# Patient Record
Sex: Male | Born: 1980 | Race: White | Hispanic: No | Marital: Single | Smoking: Current every day smoker
Health system: Southern US, Community
[De-identification: ages and names within clinical notes are randomized; demographics above are authoritative.]

## PROBLEM LIST (undated history)

## (undated) ENCOUNTER — Ambulatory Visit: Admission: EM | Payer: BC Managed Care – PPO | Source: Home / Self Care

## (undated) HISTORY — PX: TYMPANOSTOMY TUBE PLACEMENT: SHX32

## (undated) HISTORY — PX: APPENDECTOMY: SHX54

---

## 2003-03-19 ENCOUNTER — Emergency Department (HOSPITAL_COMMUNITY): Admission: EM | Admit: 2003-03-19 | Discharge: 2003-03-19 | Payer: Self-pay | Admitting: Emergency Medicine

## 2003-06-10 ENCOUNTER — Emergency Department (HOSPITAL_COMMUNITY): Admission: EM | Admit: 2003-06-10 | Discharge: 2003-06-10 | Payer: Self-pay | Admitting: Emergency Medicine

## 2003-09-12 ENCOUNTER — Emergency Department (HOSPITAL_COMMUNITY): Admission: EM | Admit: 2003-09-12 | Discharge: 2003-09-12 | Payer: Self-pay | Admitting: Emergency Medicine

## 2008-12-05 ENCOUNTER — Emergency Department (HOSPITAL_COMMUNITY): Admission: EM | Admit: 2008-12-05 | Discharge: 2008-12-05 | Payer: Self-pay | Admitting: Emergency Medicine

## 2009-04-19 ENCOUNTER — Emergency Department (HOSPITAL_COMMUNITY): Admission: EM | Admit: 2009-04-19 | Discharge: 2009-04-19 | Payer: Self-pay | Admitting: Emergency Medicine

## 2009-04-24 ENCOUNTER — Emergency Department (HOSPITAL_COMMUNITY): Admission: EM | Admit: 2009-04-24 | Discharge: 2009-04-24 | Payer: Self-pay | Admitting: Emergency Medicine

## 2009-09-20 ENCOUNTER — Emergency Department (HOSPITAL_COMMUNITY): Admission: EM | Admit: 2009-09-20 | Discharge: 2009-09-20 | Payer: Self-pay | Admitting: Emergency Medicine

## 2009-10-07 ENCOUNTER — Emergency Department (HOSPITAL_COMMUNITY): Admission: EM | Admit: 2009-10-07 | Discharge: 2009-10-07 | Payer: Self-pay | Admitting: Emergency Medicine

## 2009-12-17 ENCOUNTER — Emergency Department (HOSPITAL_COMMUNITY): Admission: EM | Admit: 2009-12-17 | Discharge: 2009-12-17 | Payer: Self-pay | Admitting: Emergency Medicine

## 2010-01-15 ENCOUNTER — Emergency Department (HOSPITAL_COMMUNITY)
Admission: EM | Admit: 2010-01-15 | Discharge: 2010-01-15 | Payer: Self-pay | Source: Home / Self Care | Admitting: Emergency Medicine

## 2011-06-02 IMAGING — CR DG CHEST 2V
2 series · 2 of 2 positions shown · non-contrast
Comparison: None.

CLINICAL DATA: Cough and congestion.  Flu symptoms.

CHEST - 2 VIEW

[view not recorded (1 of 2)]
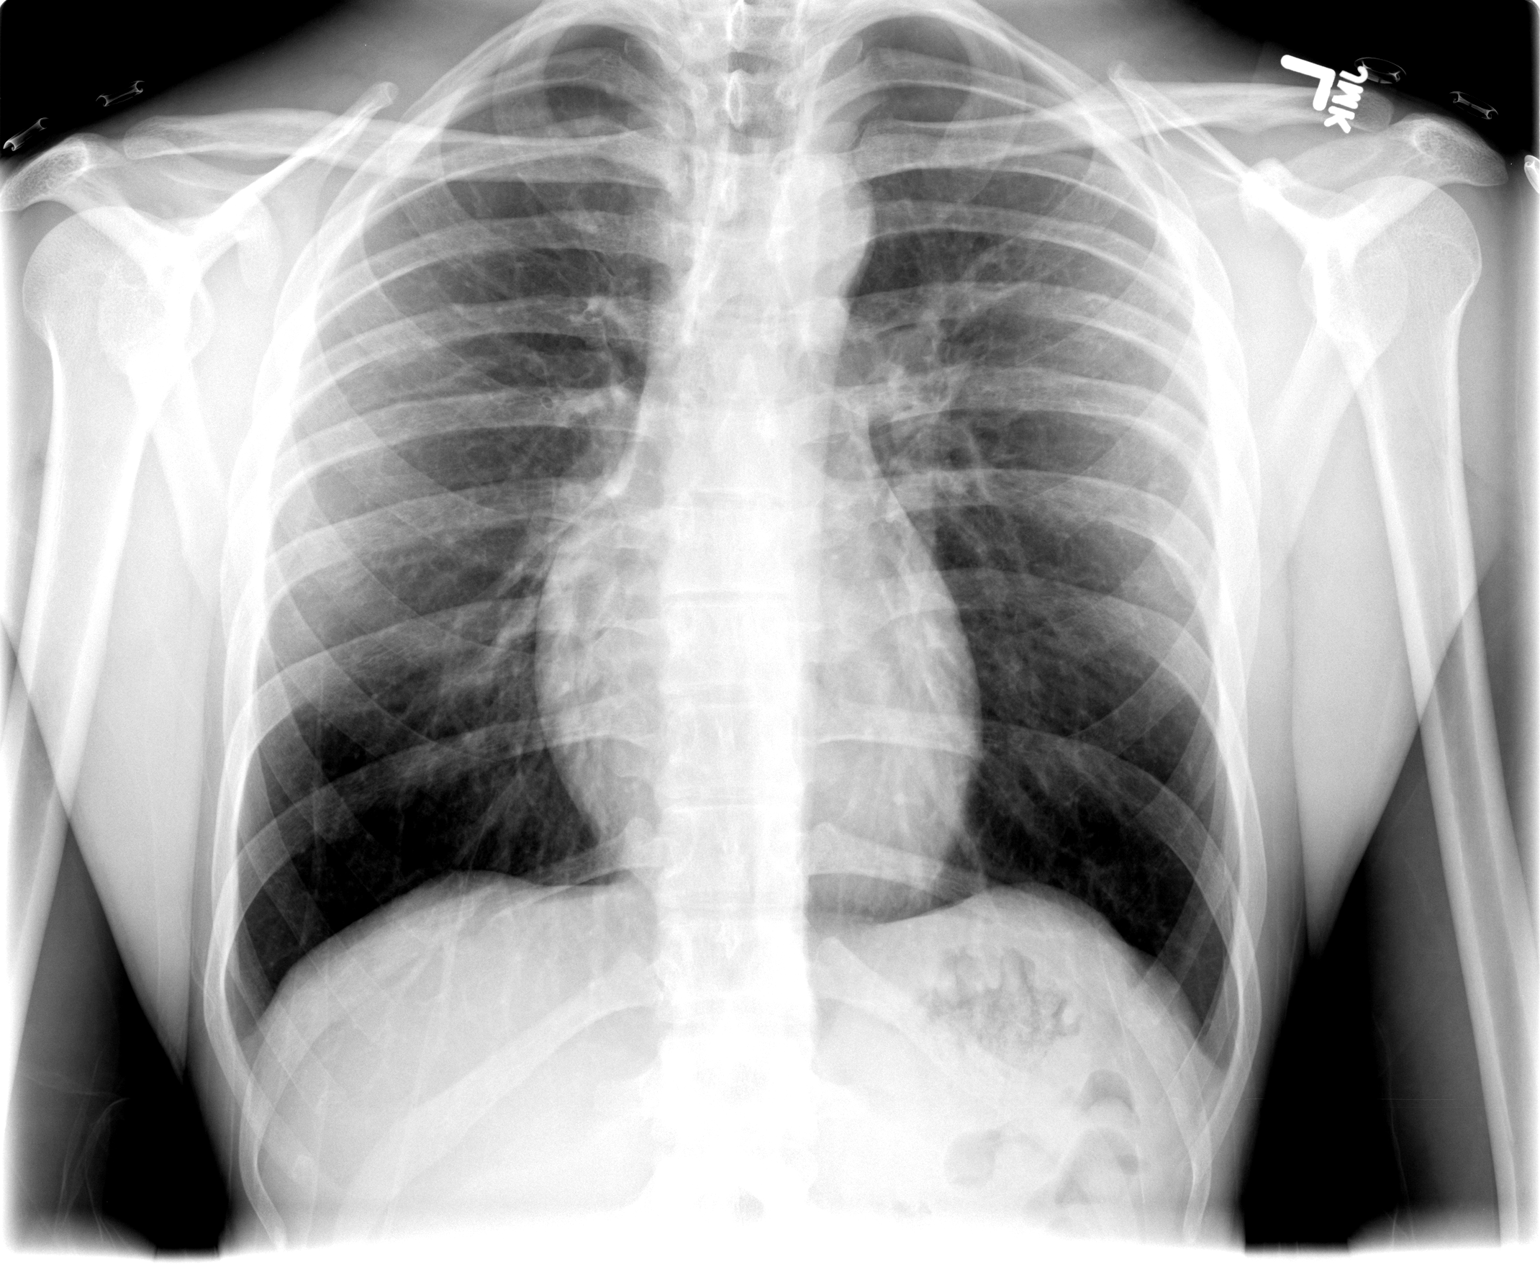

[view not recorded (2 of 2)]
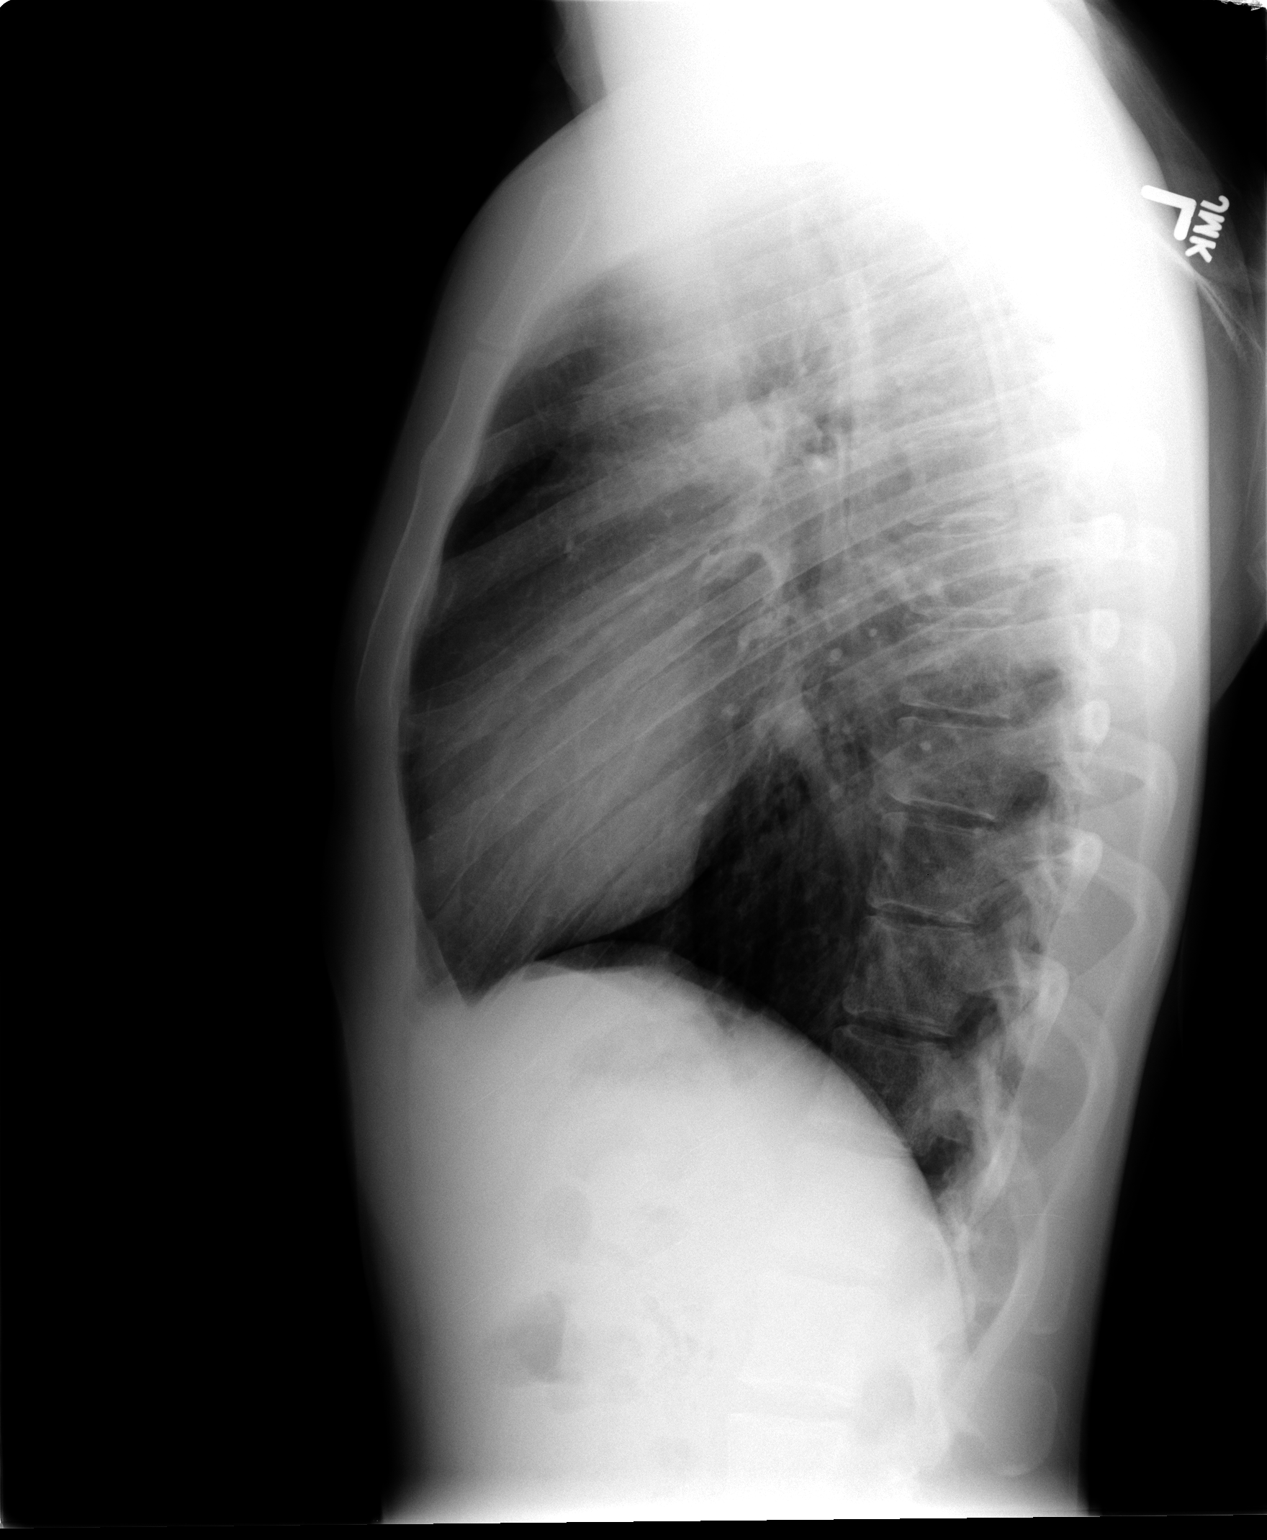

[2 of 2 positions shown; findings below may reference images not displayed]

FINDINGS: Normal cardiomediastinal silhouette.  Lungs clear of an
active process.  Bony thorax intact.
IMPRESSION: No active chest disease.

## 2012-01-30 IMAGING — CR DG CHEST 2V
2 series · 2 of 2 positions shown · non-contrast
Comparison: 04/19/2009

CLINICAL DATA: Cough

CHEST - 2 VIEW

[view not recorded (1 of 2)]
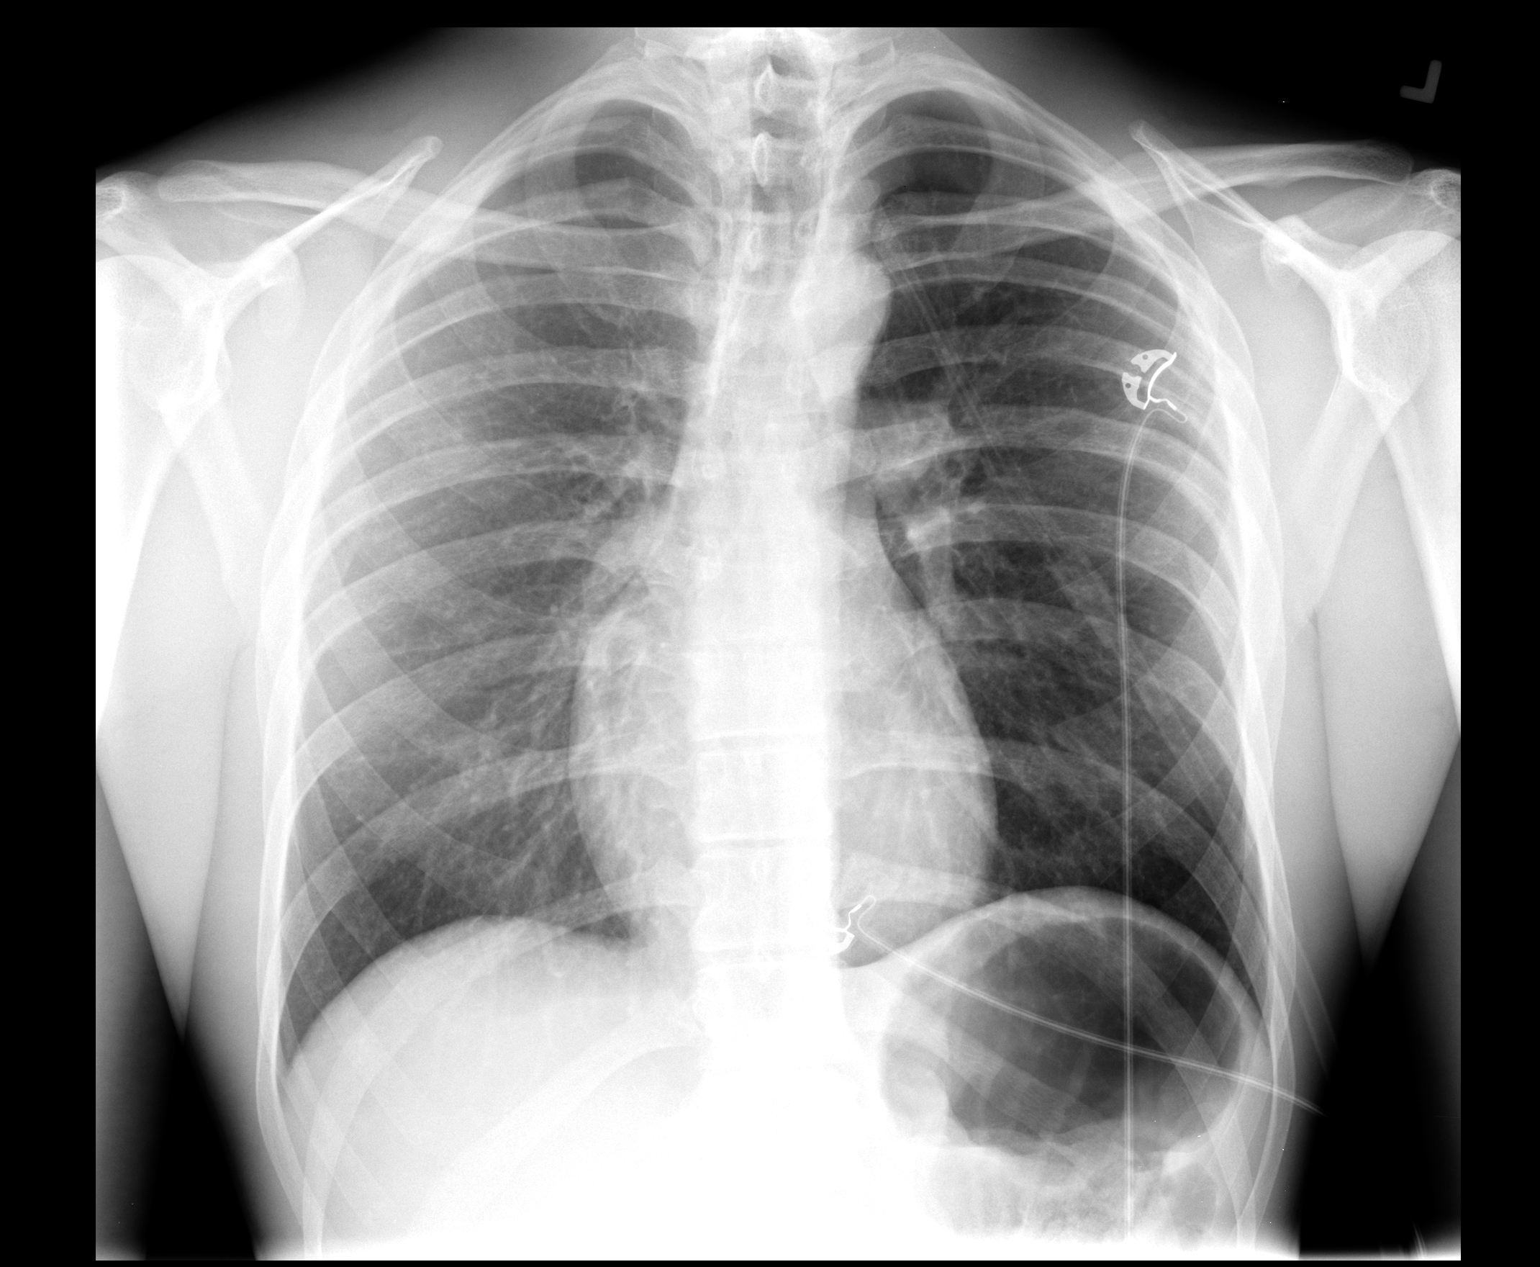

[view not recorded (2 of 2)]
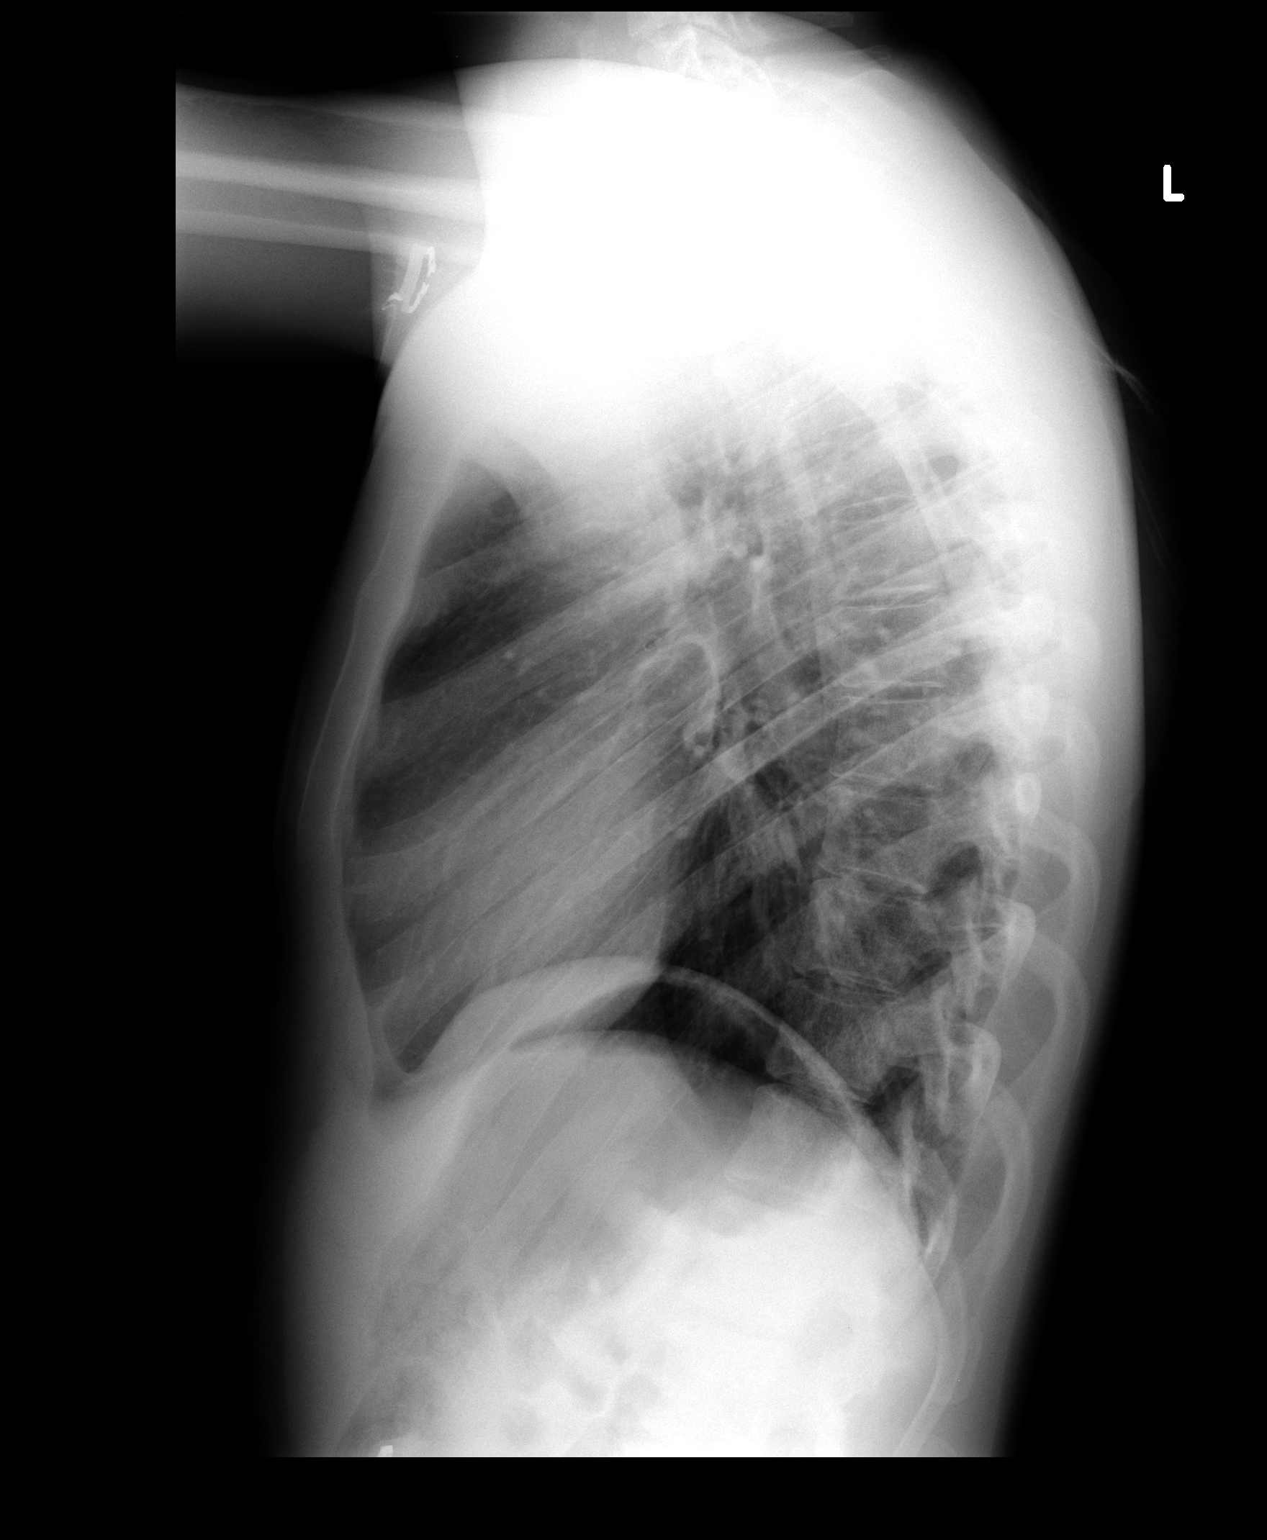

[2 of 2 positions shown; findings below may reference images not displayed]

FINDINGS: The heart size and mediastinal contours are within
normal limits.  Both lungs are clear.  The visualized skeletal
structures are unremarkable.
IMPRESSION: No active cardiopulmonary disease.

## 2017-08-28 ENCOUNTER — Other Ambulatory Visit: Payer: Self-pay

## 2017-08-28 ENCOUNTER — Emergency Department (HOSPITAL_COMMUNITY)
Admission: EM | Admit: 2017-08-28 | Discharge: 2017-08-28 | Disposition: A | Discharge status: Home / Self Care | Payer: Self-pay | Attending: Emergency Medicine | Admitting: Emergency Medicine

## 2017-08-28 ENCOUNTER — Encounter (HOSPITAL_COMMUNITY): Payer: Self-pay | Admitting: *Deleted

## 2017-08-28 DIAGNOSIS — K0889 Other specified disorders of teeth and supporting structures: Secondary | ICD-10-CM | POA: Insufficient documentation

## 2017-08-28 DIAGNOSIS — F17200 Nicotine dependence, unspecified, uncomplicated: Secondary | ICD-10-CM | POA: Insufficient documentation

## 2017-08-28 MED ORDER — IBUPROFEN 800 MG PO TABS: 800.0000 mg | ORAL_TABLET | Freq: Three times a day (TID) | ORAL | 0 refills | Status: AC

## 2017-08-28 MED ORDER — AMOXICILLIN 500 MG PO CAPS: 500.0000 mg | ORAL_CAPSULE | Freq: Three times a day (TID) | ORAL | 0 refills | Status: DC

## 2017-08-28 NOTE — ED Triage Notes (Signed)
Pt c/o right upper wisdom tooth pain that worsened this morning. Pt reports the tooth broke off 3 months ago and he was supposed to have it pulled 08/18/17 but he missed his appt. Pt reports he brushed his teeth this morning and he had a "funny taste" coming from the tooth.

## 2017-08-28 NOTE — ED Provider Notes (Signed)
[facility] EMERGENCY DEPARTMENT Provider Note   CSN: [id number] Arrival date & time: 08/28/17  1332     History   Chief Complaint Chief Complaint  Patient presents with  . Dental Pain    HPI Jordan Wilson is a 37 y.o. male.  HPI   Jordan Wilson is a 37 y.o. male who presents to the Emergency Department complaining of right lower dental pain for 3 months.  Patient states he was living in [place] at the time and he was scheduled to have the tooth extracted, but missed his appointment.  Since that time he has moved to this area and currently does not have primary care or a dentist established.  This morning, he states he noticed a "funny taste" in his mouth after brushing his teeth.  He describes pain with chewing or sensation of cold foods or liquids to his right lower tooth.  He is tried over-the-counter pain relievers and Orajel without relief.  He denies fever, chills, neck pain, facial swelling, and difficulty swallowing or breathing.   History reviewed. No pertinent past medical history.  There are no active problems to display for this patient.   Past Surgical History:  Procedure Laterality Date  . APPENDECTOMY    . TYMPANOSTOMY TUBE PLACEMENT          Home Medications    Prior to Admission medications   Not on File    Family History No family history on file.  Social History Social History   Tobacco Use  . Smoking status: Current Every Day Smoker  . Smokeless tobacco: Never Used  Substance Use Topics  . Alcohol use: Never    Frequency: Never  . Drug use: Never     Allergies   Patient has no known allergies.   Review of Systems Review of Systems  Constitutional: Negative for appetite change and fever.  HENT: Positive for dental problem. Negative for congestion, facial swelling, sore throat and trouble swallowing.   Eyes: Negative for pain and visual disturbance.  Musculoskeletal: Negative for neck pain and neck stiffness.    Neurological: Negative for dizziness, facial asymmetry and headaches.  Hematological: Negative for adenopathy.  All other systems reviewed and are negative.    Physical Exam Updated Vital Signs BP 110/63   Pulse 64   Temp 97.6 F (36.4 C) (Oral)   Resp 16   Ht 5\' 9"  (1.753 m)   Wt 63.5 kg (140 lb)   SpO2 100%   BMI 20.67 kg/m   Physical Exam  Constitutional: He appears well-developed and well-nourished. No distress.  HENT:  Head: Normocephalic and atraumatic.  Right Ear: Tympanic membrane and ear canal normal.  Left Ear: Tympanic membrane and ear canal normal.  Mouth/Throat: Uvula is midline, oropharynx is clear and moist and mucous membranes are normal. No trismus in the jaw. Dental caries present. No dental abscesses or uvula swelling.  ttp and dental caries of the right third molar.  Tooth is partially avulsed due to caries.  No facial swelling, obvious dental abscess, trismus, or sublingual abnml.    Neck: Normal range of motion. Neck supple.  Cardiovascular: Normal rate, regular rhythm and intact distal pulses.  Pulmonary/Chest: Effort normal and breath sounds normal.  Musculoskeletal: Normal range of motion.  Lymphadenopathy:    He has no cervical adenopathy.  Neurological: He is alert. Coordination normal.  Skin: Skin is warm and dry.  Psychiatric: He has a normal mood and affect.  Nursing note and vitals reviewed.  ED Treatments / Results  Labs (all labs ordered are listed, but only abnormal results are displayed) Labs Reviewed - No data to display  EKG None  Radiology No results found.  Procedures Procedures (including critical care time)  Medications Ordered in ED Medications - No data to display   Initial Impression / Assessment and Plan / ED Course  I have reviewed the triage vital signs and the nursing notes.  Pertinent labs & imaging results that were available during my care of the patient were reviewed by me and considered in my medical  decision making (see chart for details).     Vital signs reviewed.  Patient is well-appearing.  Nontoxic.  Multiple dental caries with partial avulsion of the right lower third molar.  No abscess.  No concerning symptoms for Ludewig's angina.  Patient appears safe for discharge home, agrees to antibiotics and close follow-up with dentistry.  Final Clinical Impressions(s) / ED Diagnoses   Final diagnoses:  Pain, dental    ED Discharge Orders    None       Pauline Ausriplett, Lesle Faron, PA-C 08/28/17 1448    Mesner, Barbara CowerJason, MD 08/30/17 [phone]

## 2017-08-28 NOTE — Discharge Instructions (Addendum)
Contact 1 of the dentist on the referral list provided.

## 2018-01-24 ENCOUNTER — Emergency Department (HOSPITAL_COMMUNITY)
Admission: EM | Admit: 2018-01-24 | Discharge: 2018-01-24 | Disposition: A | Discharge status: Home / Self Care | Payer: Self-pay | Attending: Emergency Medicine | Admitting: Emergency Medicine

## 2018-01-24 ENCOUNTER — Encounter (HOSPITAL_COMMUNITY): Payer: Self-pay | Admitting: Emergency Medicine

## 2018-01-24 ENCOUNTER — Other Ambulatory Visit: Payer: Self-pay

## 2018-01-24 DIAGNOSIS — W260XXA Contact with knife, initial encounter: Secondary | ICD-10-CM | POA: Insufficient documentation

## 2018-01-24 DIAGNOSIS — S51812A Laceration without foreign body of left forearm, initial encounter: Secondary | ICD-10-CM | POA: Insufficient documentation

## 2018-01-24 DIAGNOSIS — Y999 Unspecified external cause status: Secondary | ICD-10-CM | POA: Insufficient documentation

## 2018-01-24 DIAGNOSIS — F17200 Nicotine dependence, unspecified, uncomplicated: Secondary | ICD-10-CM | POA: Insufficient documentation

## 2018-01-24 DIAGNOSIS — Z23 Encounter for immunization: Secondary | ICD-10-CM | POA: Insufficient documentation

## 2018-01-24 DIAGNOSIS — Y9289 Other specified places as the place of occurrence of the external cause: Secondary | ICD-10-CM | POA: Insufficient documentation

## 2018-01-24 DIAGNOSIS — Y939 Activity, unspecified: Secondary | ICD-10-CM | POA: Insufficient documentation

## 2018-01-24 MED ORDER — TETANUS-DIPHTH-ACELL PERTUSSIS 5-2.5-18.5 LF-MCG/0.5 IM SUSP
0.5000 mL | Freq: Once | INTRAMUSCULAR | Status: AC
  Administered 2018-01-24: 0.5 mL via INTRAMUSCULAR
  Filled 2018-01-24: qty 0.5

## 2018-01-24 MED ORDER — LIDOCAINE-EPINEPHRINE (PF) 1 %-1:200000 IJ SOLN
10.0000 mL | Freq: Once | INTRAMUSCULAR | Status: AC
  Administered 2018-01-24: 10 mL

## 2018-01-24 MED ORDER — LIDOCAINE HCL (PF) 1 % IJ SOLN
INTRAMUSCULAR | Status: AC
  Filled 2018-01-24: qty 5

## 2018-01-24 MED ORDER — POVIDONE-IODINE 10 % EX SOLN
CUTANEOUS | Status: AC
  Administered 2018-01-24: 1
  Filled 2018-01-24: qty 15

## 2018-01-24 NOTE — ED Triage Notes (Signed)
Pt was cutting tape off of boxes and cut his left arm. Bleeding controlled

## 2018-01-24 NOTE — Discharge Instructions (Addendum)
Keep strips on until they fall off.  Try to keep them dry.

## 2018-01-24 NOTE — ED Provider Notes (Signed)
[facility] EMERGENCY DEPARTMENT Provider Note   CSN: [id number] Arrival date & time: 01/24/18  1201     History   Chief Complaint Chief Complaint  Patient presents with  . Laceration    HPI Jordan Wilson is a 37 y.o. male.  Accidental laceration on left forearm with a box cutter at work.  No obvious tendon or nerve injury.  Full range of motion of hand.  No other injuries.     History reviewed. No pertinent past medical history.  There are no active problems to display for this patient.   Past Surgical History:  Procedure Laterality Date  . APPENDECTOMY    . TYMPANOSTOMY TUBE PLACEMENT          Home Medications    Prior to Admission medications   Medication Sig Start Date End Date Taking? Authorizing Provider  amoxicillin (AMOXIL) 500 MG capsule Take 1 capsule (500 mg total) by mouth 3 (three) times daily. 08/28/17   Triplett, Tammy, PA-C  ibuprofen (ADVIL,MOTRIN) 800 MG tablet Take 1 tablet (800 mg total) by mouth 3 (three) times daily. 08/28/17   Pauline Ausriplett, Tammy, PA-C    Family History History reviewed. No pertinent family history.  Social History Social History   Tobacco Use  . Smoking status: Current Every Day Smoker    Packs/day: 1.00  . Smokeless tobacco: Never Used  Substance Use Topics  . Alcohol use: Never    Frequency: Never  . Drug use: Never     Allergies   Patient has no known allergies.   Review of Systems Review of Systems  All other systems reviewed and are negative.    Physical Exam Updated Vital Signs BP (!) 112/93 (BP Location: Left Arm)   Pulse 75   Temp 98.1 F (36.7 C) (Temporal)   Resp 14   Ht 5\' 9"  (1.753 m)   Wt 68 kg   SpO2 100%   BMI 22.15 kg/m   Physical Exam Vitals signs and nursing note reviewed.  Constitutional:      Appearance: He is well-developed.  HENT:     Head: Normocephalic and atraumatic.  Eyes:     Conjunctiva/sclera: Conjunctivae normal.  Neck:     Musculoskeletal: Neck supple.    Pulmonary:     Effort: Pulmonary effort is normal.  Musculoskeletal: Normal range of motion.  Skin:    General: Skin is warm and dry.     Comments: Right forearm: 2.0 cm laceration on anterior aspect of mid forearm.  Neurovascular intact.  Neurological:     Mental Status: He is alert and oriented to person, place, and time.  Psychiatric:        Behavior: Behavior normal.      ED Treatments / Results  Labs (all labs ordered are listed, but only abnormal results are displayed) Labs Reviewed - No data to display  EKG None  Radiology No results found.  Procedures .Marland Kitchen.Laceration Repair Date/Time: 01/24/2018 4:13 PM Performed by: Donnetta Hutchingook, Demica Zook, MD Authorized by: Donnetta Hutchingook, Marceil Welp, MD   Consent:    Consent obtained:  Verbal   Consent given by:  Patient Anesthesia (see MAR for exact dosages):    Anesthesia method:  None Laceration details:    Location:  Shoulder/arm   Shoulder/arm location:  L lower arm   Wound length (cm): 2.0. Repair type:    Repair type:  Simple Exploration:    Wound exploration: wound explored through full range of motion     Contaminated: no   Treatment:  Visualized foreign bodies/material removed: no   Skin repair:    Repair method:  Steri-Strips Approximation:    Approximation:  Loose Post-procedure details:    Dressing:  Sterile dressing   Patient tolerance of procedure:  Tolerated well, no immediate complications   (including critical care time)  Medications Ordered in ED Medications  Tdap (BOOSTRIX) injection 0.5 mL (0.5 mLs Intramuscular Given 01/24/18 1225)  lidocaine-EPINEPHrine (XYLOCAINE-EPINEPHrine) 1 %-1:200000 (PF) injection 10 mL (10 mLs Infiltration Given 01/24/18 1224)  povidone-iodine (BETADINE) 10 % external solution (1 application  Given 01/24/18 1224)     Initial Impression / Assessment and Plan / ED Course  I have reviewed the triage vital signs and the nursing notes.  Pertinent labs & imaging results that were available  during my care of the patient were reviewed by me and considered in my medical decision making (see chart for details).     Accidental laceration to left mid forearm anterior aspect.  Neurovascular intact.  Steri-Strips.  Final Clinical Impressions(s) / ED Diagnoses   Final diagnoses:  Laceration of left forearm, initial encounter    ED Discharge Orders    None       Donnetta Hutching, MD 01/24/18 1615

## 2019-11-23 ENCOUNTER — Other Ambulatory Visit: Payer: Self-pay

## 2019-11-23 DIAGNOSIS — Z20822 Contact with and (suspected) exposure to covid-19: Secondary | ICD-10-CM

## 2019-11-24 LAB — SPECIMEN STATUS REPORT

## 2019-11-24 LAB — NOVEL CORONAVIRUS, NAA: SARS-CoV-2, NAA: DETECTED — AB

## 2019-11-24 LAB — SARS-COV-2, NAA 2 DAY TAT

## 2019-12-04 ENCOUNTER — Other Ambulatory Visit: Payer: Self-pay | Admitting: *Deleted

## 2019-12-04 ENCOUNTER — Other Ambulatory Visit: Payer: Self-pay

## 2019-12-04 DIAGNOSIS — Z20822 Contact with and (suspected) exposure to covid-19: Secondary | ICD-10-CM

## 2019-12-05 LAB — NOVEL CORONAVIRUS, NAA: SARS-CoV-2, NAA: NOT DETECTED

## 2019-12-05 LAB — SARS-COV-2, NAA 2 DAY TAT

## 2022-08-09 ENCOUNTER — Ambulatory Visit
Admission: RE | Admit: 2022-08-09 | Discharge: 2022-08-09 | Disposition: A | Discharge status: Home / Self Care | Payer: BC Managed Care – PPO | Source: Ambulatory Visit | Attending: Nurse Practitioner | Admitting: Nurse Practitioner

## 2022-08-09 VITALS — BP 107/69 | HR 85 | Temp 97.4°F | Resp 16

## 2022-08-09 DIAGNOSIS — K0889 Other specified disorders of teeth and supporting structures: Secondary | ICD-10-CM

## 2022-08-09 MED ORDER — AMOXICILLIN-POT CLAVULANATE 875-125 MG PO TABS: 1.0000 | ORAL_TABLET | Freq: Two times a day (BID) | ORAL | 0 refills | Status: AC

## 2022-08-09 NOTE — ED Triage Notes (Signed)
Pt presents with sharp pain on bottom teeth on left side, with swelling that started a week ago. Taking ibuprofen, tylenol, oral gel and clove oil with no relief.

## 2022-08-09 NOTE — Discharge Instructions (Addendum)
Take the Augmentin as prescribed to treat infection inside your mouth. Continue Tylenol 500 to 1000 mg every 6 hours alternating with ibuprofen 800 mg every 8 hours as needed for pain.  You can continue oral gel as well. Recommend eating soft, easy to digest foods next few days. Follow-up with dentist-check out the resources within this handout.

## 2022-08-09 NOTE — ED Provider Notes (Signed)
[facility]    CSN: [id number] Arrival date & time: 08/09/22  1250      History   Chief Complaint Chief Complaint  Patient presents with   Oral Swelling    Swelling and severe pain on left side of facial area possible tooth infection - Entered by patient    HPI Cortavius L Percle is a 42 y.o. male.   Patient presents today for the lower dental pain that has been present for the past week.  Reports he feels like his jaw began swelling yesterday and he is having increased pain.  He has been taking Tylenol and ibuprofen as well as Orajel for symptoms without much benefit.  He denies drainage inside his mouth, fever, nausea/vomiting.  No sinus pressure or pain.  Reports he is aware he has cavities and broken teeth inside his mouth, is currently looking for dentist to follow-up with.    History reviewed. No pertinent past medical history.  There are no problems to display for this patient.   Past Surgical History:  Procedure Laterality Date   APPENDECTOMY     TYMPANOSTOMY TUBE PLACEMENT         Home Medications    Prior to Admission medications   Medication Sig Start Date End Date Taking? Authorizing Provider  amoxicillin-clavulanate (AUGMENTIN) 875-125 MG tablet Take 1 tablet by mouth 2 (two) times daily for 7 days. 08/09/22 08/16/22 Yes Valentino Nose, NP  ibuprofen (ADVIL,MOTRIN) 800 MG tablet Take 1 tablet (800 mg total) by mouth 3 (three) times daily. 08/28/17  Yes Triplett, Tammy, PA-C    Family History History reviewed. No pertinent family history.  Social History Social History   Tobacco Use   Smoking status: Every Day    Packs/day: 1    Types: Cigarettes   Smokeless tobacco: Never  Substance Use Topics   Alcohol use: Never   Drug use: Never     Allergies   Patient has no known allergies.   Review of Systems Review of Systems Per HPI  Physical Exam Triage Vital Signs ED Triage Vitals  Enc Vitals Group     BP 08/09/22 1307 107/69      Pulse Rate 08/09/22 1307 85     Resp 08/09/22 1307 16     Temp 08/09/22 1307 (!) 97.4 F (36.3 C)     Temp Source 08/09/22 1307 Oral     SpO2 08/09/22 1307 95 %     Weight --      Height --      Head Circumference --      Peak Flow --      Pain Score 08/09/22 1308 9     Pain Loc --      Pain Edu? --      Excl. in GC? --    No data found.  Updated Vital Signs BP 107/69 (BP Location: Right Arm)   Pulse 85   Temp (!) 97.4 F (36.3 C) (Oral)   Resp 16   SpO2 95%   Visual Acuity Right Eye Distance:   Left Eye Distance:   Bilateral Distance:    Right Eye Near:   Left Eye Near:    Bilateral Near:     Physical Exam Vitals and nursing note reviewed.  Constitutional:      General: He is not in acute distress.    Appearance: Normal appearance. He is not toxic-appearing.  HENT:     Head: Normocephalic and atraumatic.     Right Ear:  External ear normal.     Left Ear: External ear normal.     Mouth/Throat:     Mouth: Mucous membranes are moist.     Dentition: Abnormal dentition. Dental tenderness, gingival swelling and gum lesions present. No dental abscesses.     Pharynx: Oropharynx is clear.  Eyes:     General: No scleral icterus.    Extraocular Movements: Extraocular movements intact.  Pulmonary:     Effort: Pulmonary effort is normal. No respiratory distress.  Musculoskeletal:     Cervical back: Normal range of motion.  Lymphadenopathy:     Cervical: Cervical adenopathy present.  Skin:    General: Skin is warm and dry.     Capillary Refill: Capillary refill takes less than 2 seconds.     Coloration: Skin is not jaundiced or pale.     Findings: No erythema.  Neurological:     Mental Status: He is alert and oriented to person, place, and time.  Psychiatric:        Behavior: Behavior is cooperative.      UC Treatments / Results  Labs (all labs ordered are listed, but only abnormal results are displayed) Labs Reviewed - No data to  display  EKG   Radiology No results found.  Procedures Procedures (including critical care time)  Medications Ordered in UC Medications - No data to display  Initial Impression / Assessment and Plan / UC Course  I have reviewed the triage vital signs and the nursing notes.  Pertinent labs & imaging results that were available during my care of the patient were reviewed by me and considered in my medical decision making (see chart for details).   Patient is well-appearing, normotensive, afebrile, not tachycardic, not tachypneic, oxygenating well on room air.    1. Dentalgia Will cover for bacterial infection with Augmentin twice daily for 7 days Ongoing supportive care discussed -Tylenol alternating with Motrin, oral gel Dental resources given  The patient was given the opportunity to ask questions.  All questions answered to their satisfaction.  The patient is in agreement to this plan.    Final Clinical Impressions(s) / UC Diagnoses   Final diagnoses:  Dentalgia     Discharge Instructions      Take the Augmentin as prescribed to treat infection inside your mouth. Continue Tylenol 500 to 1000 mg every 6 hours alternating with ibuprofen 800 mg every 8 hours as needed for pain.  You can continue oral gel as well. Recommend eating soft, easy to digest foods next few days. Follow-up with dentist-check out the resources within this handout.   ED Prescriptions     Medication Sig Dispense Auth. Provider   amoxicillin-clavulanate (AUGMENTIN) 875-125 MG tablet Take 1 tablet by mouth 2 (two) times daily for 7 days. 14 tablet Valentino Nose, NP      PDMP not reviewed this encounter.   Valentino Nose, NP 08/09/22 1416

## 2022-09-28 ENCOUNTER — Ambulatory Visit: Payer: Self-pay

## 2022-09-30 ENCOUNTER — Ambulatory Visit: Payer: Self-pay

## 2022-10-06 ENCOUNTER — Other Ambulatory Visit: Payer: Self-pay

## 2022-10-06 ENCOUNTER — Ambulatory Visit
Admission: EM | Admit: 2022-10-06 | Discharge: 2022-10-06 | Disposition: A | Discharge status: Home / Self Care | Payer: BC Managed Care – PPO | Attending: Family Medicine | Admitting: Family Medicine

## 2022-10-06 VITALS — BP 119/74 | HR 70 | Temp 98.1°F | Resp 18

## 2022-10-06 DIAGNOSIS — K047 Periapical abscess without sinus: Secondary | ICD-10-CM

## 2022-10-06 MED ORDER — AMOXICILLIN-POT CLAVULANATE 875-125 MG PO TABS: 1.0000 | ORAL_TABLET | Freq: Two times a day (BID) | ORAL | 0 refills | Status: AC

## 2022-10-06 MED ORDER — CHLORHEXIDINE GLUCONATE 0.12 % MT SOLN: 15.0000 mL | Freq: Two times a day (BID) | OROMUCOSAL | 0 refills | Status: AC

## 2022-10-06 NOTE — ED Provider Notes (Signed)
[facility]    CSN: [id number] Arrival date & time: 10/06/22  0855      History   Chief Complaint Chief Complaint  Patient presents with   Dental Pain    HPI Jordan Wilson is a 42 y.o. male.   Presenting today with bilateral lower dental pain from known broken and decaying molars that have become infected multiple times recently.  He states he was given a resource sheet last time he came for this issue and everyone was booked up 3 to 4 months so he has not yet been able to get in with a dentist.  Denies fever, chills, difficulty breathing or swallowing, nausea, vomiting.  Try over-the-counter pain relievers with no relief.    History reviewed. No pertinent past medical history.  There are no problems to display for this patient.   Past Surgical History:  Procedure Laterality Date   APPENDECTOMY     TYMPANOSTOMY TUBE PLACEMENT         Home Medications    Prior to Admission medications   Medication Sig Start Date End Date Taking? Authorizing Provider  amoxicillin-clavulanate (AUGMENTIN) 875-125 MG tablet Take 1 tablet by mouth every 12 (twelve) hours. 10/06/22  Yes Particia Nearing, PA-C  chlorhexidine (PERIDEX) 0.12 % solution Use as directed 15 mLs in the mouth or throat 2 (two) times daily. 10/06/22  Yes Particia Nearing, PA-C  ibuprofen (ADVIL,MOTRIN) 800 MG tablet Take 1 tablet (800 mg total) by mouth 3 (three) times daily. 08/28/17   Pauline Aus, PA-C    Family History History reviewed. No pertinent family history.  Social History Social History   Tobacco Use   Smoking status: Every Day    Current packs/day: 1.00    Types: Cigarettes   Smokeless tobacco: Never  Substance Use Topics   Alcohol use: Never   Drug use: Never     Allergies   Patient has no known allergies.   Review of Systems Review of Systems Per HPI  Physical Exam Triage Vital Signs ED Triage Vitals  Encounter Vitals Group     BP 10/06/22 0906  119/74     Systolic BP Percentile --      Diastolic BP Percentile --      Pulse Rate 10/06/22 0906 70     Resp 10/06/22 0906 18     Temp 10/06/22 0906 98.1 F (36.7 C)     Temp Source 10/06/22 0906 Oral     SpO2 10/06/22 0906 95 %     Weight --      Height --      Head Circumference --      Peak Flow --      Pain Score 10/06/22 0905 8     Pain Loc --      Pain Education --      Exclude from Growth Chart --    No data found.  Updated Vital Signs BP 119/74 (BP Location: Right Arm)   Pulse 70   Temp 98.1 F (36.7 C) (Oral)   Resp 18   SpO2 95%   Visual Acuity Right Eye Distance:   Left Eye Distance:   Bilateral Distance:    Right Eye Near:   Left Eye Near:    Bilateral Near:     Physical Exam Vitals and nursing note reviewed.  Constitutional:      Appearance: Normal appearance.  HENT:     Head: Atraumatic.     Mouth/Throat:     Mouth:  Mucous membranes are moist.     Comments: Poor dentition diffusely, particularly posterior molars with significant decay, breakage and gingival erythema and edema Eyes:     Extraocular Movements: Extraocular movements intact.     Conjunctiva/sclera: Conjunctivae normal.  Cardiovascular:     Rate and Rhythm: Normal rate and regular rhythm.  Pulmonary:     Effort: Pulmonary effort is normal.     Breath sounds: Normal breath sounds.  Musculoskeletal:        General: Normal range of motion.     Cervical back: Normal range of motion and neck supple.  Skin:    General: Skin is warm and dry.  Neurological:     General: No focal deficit present.     Mental Status: He is oriented to person, place, and time.  Psychiatric:        Mood and Affect: Mood normal.        Thought Content: Thought content normal.        Judgment: Judgment normal.      UC Treatments / Results  Labs (all labs ordered are listed, but only abnormal results are displayed) Labs Reviewed - No data to display  EKG   Radiology No results  found.  Procedures Procedures (including critical care time)  Medications Ordered in UC Medications - No data to display  Initial Impression / Assessment and Plan / UC Course  I have reviewed the triage vital signs and the nursing notes.  Pertinent labs & imaging results that were available during my care of the patient were reviewed by me and considered in my medical decision making (see chart for details).     Treat with Augmentin, Peridex rinse, over-the-counter pain relievers.  Dental resources given again per his request and he plans to call make an appointment today.  Follow-up for worsening symptoms.  Final Clinical Impressions(s) / UC Diagnoses   Final diagnoses:  Dental infection   Discharge Instructions   None    ED Prescriptions     Medication Sig Dispense Auth. Provider   amoxicillin-clavulanate (AUGMENTIN) 875-125 MG tablet Take 1 tablet by mouth every 12 (twelve) hours. 14 tablet Particia Nearing, [place]   chlorhexidine (PERIDEX) 0.12 % solution Use as directed 15 mLs in the mouth or throat 2 (two) times daily. 120 mL Particia Nearing, [place]      PDMP not reviewed this encounter.   Roosvelt Maser [place], [place] 10/06/22 [phone]

## 2022-10-06 NOTE — ED Triage Notes (Signed)
Pt reports continued dental pain. States last time received abx and reports has tried to call dental resources and reports is "booked out several months" and reports attended dental clinic but reports wasn't able to be seen.
